# Patient Record
Sex: Female | Born: 1935 | Race: White | Hispanic: No | Smoking: Never smoker
Health system: Southern US, Community
[De-identification: ages and names within clinical notes are randomized; demographics above are authoritative.]

## PROBLEM LIST (undated history)

## (undated) DIAGNOSIS — I2510 Atherosclerotic heart disease of native coronary artery without angina pectoris: Secondary | ICD-10-CM

## (undated) DIAGNOSIS — M1990 Unspecified osteoarthritis, unspecified site: Secondary | ICD-10-CM

## (undated) DIAGNOSIS — I10 Essential (primary) hypertension: Secondary | ICD-10-CM

## (undated) DIAGNOSIS — E119 Type 2 diabetes mellitus without complications: Secondary | ICD-10-CM

## (undated) DIAGNOSIS — I639 Cerebral infarction, unspecified: Secondary | ICD-10-CM

## (undated) DIAGNOSIS — I509 Heart failure, unspecified: Secondary | ICD-10-CM

---

## 2014-10-19 ENCOUNTER — Ambulatory Visit
Admission: EM | Admit: 2014-10-19 | Discharge: 2014-10-19 | Disposition: A | Discharge status: Home / Self Care | Payer: Medicare HMO | Attending: Family Medicine | Admitting: Family Medicine

## 2014-10-19 ENCOUNTER — Ambulatory Visit: Payer: Medicare HMO

## 2014-10-19 DIAGNOSIS — I2510 Atherosclerotic heart disease of native coronary artery without angina pectoris: Secondary | ICD-10-CM | POA: Insufficient documentation

## 2014-10-19 DIAGNOSIS — E119 Type 2 diabetes mellitus without complications: Secondary | ICD-10-CM | POA: Insufficient documentation

## 2014-10-19 DIAGNOSIS — S92301A Fracture of unspecified metatarsal bone(s), right foot, initial encounter for closed fracture: Secondary | ICD-10-CM

## 2014-10-19 DIAGNOSIS — S92351A Displaced fracture of fifth metatarsal bone, right foot, initial encounter for closed fracture: Secondary | ICD-10-CM | POA: Diagnosis not present

## 2014-10-19 DIAGNOSIS — I509 Heart failure, unspecified: Secondary | ICD-10-CM | POA: Insufficient documentation

## 2014-10-19 DIAGNOSIS — X58XXXA Exposure to other specified factors, initial encounter: Secondary | ICD-10-CM | POA: Insufficient documentation

## 2014-10-19 DIAGNOSIS — Z8673 Personal history of transient ischemic attack (TIA), and cerebral infarction without residual deficits: Secondary | ICD-10-CM | POA: Insufficient documentation

## 2014-10-19 DIAGNOSIS — M79671 Pain in right foot: Secondary | ICD-10-CM | POA: Diagnosis present

## 2014-10-19 DIAGNOSIS — I10 Essential (primary) hypertension: Secondary | ICD-10-CM | POA: Diagnosis not present

## 2014-10-19 HISTORY — DX: Type 2 diabetes mellitus without complications: E11.9

## 2014-10-19 HISTORY — DX: Cerebral infarction, unspecified: I63.9

## 2014-10-19 HISTORY — DX: Heart failure, unspecified: I50.9

## 2014-10-19 HISTORY — DX: Atherosclerotic heart disease of native coronary artery without angina pectoris: I25.10

## 2014-10-19 HISTORY — DX: Unspecified osteoarthritis, unspecified site: M19.90

## 2014-10-19 HISTORY — DX: Essential (primary) hypertension: I10

## 2014-10-19 NOTE — Discharge Instructions (Signed)
Keep splint in place. Use walker when ambulating. Take over-the-counter Tylenol as needed for pain. Apply ice and elevate.  Follow-up with podiatry or orthopedist within 1 week. You may follow-up with podiatrist or orthopedist of your choice or the above. OR you can see your orthopedist in [place].   Return to urgent care for new or worsening concerns.  Metatarsal Fracture A metatarsal fracture is a break in the bone(s) of the foot. These are the bones of the foot that connect your toes to the bones of the ankle. DIAGNOSIS  The diagnoses of these fractures are usually made with X-rays. If there are problems in the forefoot and x-rays are normal a later bone scan will usually make the diagnosis.  TREATMENT AND HOME CARE INSTRUCTIONS  Treatment may or may not include a cast or walking shoe. When casts are needed the use is usually for short periods of time so as not to slow down healing with muscle wasting (atrophy).  Activities should be stopped until further advised by your caregiver.  Wear shoes with adequate shock absorbing capabilities and stiff soles.  Alternative exercise may be undertaken while waiting for healing. These may include bicycling and swimming, or as your caregiver suggests.  It is important to keep all follow-up visits or specialty referrals. The failure to keep these appointments could result in improper bone healing and chronic pain or disability.  Warning: Do not drive a car or operate a motor vehicle until your caregiver specifically tells you it is safe to do so. IF YOU DO NOT HAVE A CAST OR SPLINT:  You may walk on your injured foot as tolerated or advised.  Do not put any weight on your injured foot for as long as directed by your caregiver. Slowly increase the amount of time you walk on the foot as the pain allows or as advised.  Use crutches until you can bear weight without pain. A gradual increase in weight bearing may help.  Apply ice to the injury for  15-20 minutes each hour while awake for the first 2 days. Put the ice in a plastic bag and place a towel between the bag of ice and your skin.  Only take over-the-counter or prescription medicines for pain, discomfort, or fever as directed by your caregiver. SEEK IMMEDIATE MEDICAL CARE IF:   Your cast gets damaged or breaks.  You have continued severe pain or more swelling than you did before the cast was put on, or the pain is not controlled with medications.  Your skin or nails below the injury turn blue or grey, or feel cold or numb.  There is a bad smell, or new stains or pus-like (purulent) drainage coming from the cast. MAKE SURE YOU:   Understand these instructions.  Will watch your condition.  Will get help right away if you are not doing well or get worse. Document Released: 10/13/2001 Document Revised: 04/15/2011 Document Reviewed: 09/04/2007 [facility] Patient Information 2015 [place], [place]. This information is not intended to replace advice given to you by your health care provider. Make sure you discuss any questions you have with your health care provider.

## 2014-10-19 NOTE — ED Provider Notes (Signed)
[facility] Emergency Department Provider Note  ____________________________________________  Time seen: Approximately 12:41 PM  I have reviewed the triage vital signs and the nursing notes.   HISTORY  Chief Complaint Foot Injury   HPI Sarah Hull is a 81 y.o. female  presents for the complaints of right lateral foot pain. Patient reports that this past Monday when she was getting up out of her chair she states that her right foot slightly rolled outwards which causes pain. Patient states that she did not fall. States that she was able to catch herself on her table. Denies other pain or injury. Patient reports that she has had a right lateral foot pain since the incident. Patient brother with her at urgent care. Patient reports that she lives at home alone but does live near her family including her brother.  Patient states that pain is mostly with walking. States that she's having to walk more on the inside of her foot. States pain is currently 3 out of 10 and aching. Patient reports bruising has been present since as well. Denies numbness or tingling sensation.  Patient again denies fall, head injury or loss of consciousness. Patient reports that she does ambulate at home with a walker or a cane.   Past Medical History  Diagnosis Date  . Diabetes mellitus without complication   . Hypertension   . CHF (congestive heart failure)   . Stroke   . Arthritis   . Coronary artery disease     There are no active problems to display for this patient.   History reviewed. No pertinent past surgical history.  Current Outpatient Rx  Name  Route  Sig  Dispense  Refill  . albuterol (PROVENTIL HFA;VENTOLIN HFA) 108 (90 BASE) MCG/ACT inhaler   Inhalation   Inhale into the lungs every 6 (six) hours as needed for wheezing or shortness of breath.         . budesonide-formoterol (SYMBICORT) 160-4.5 MCG/ACT inhaler   Inhalation   Inhale 2 puffs into the lungs 2  (two) times daily.         . dabigatran (PRADAXA) 75 MG CAPS capsule   Oral   Take 75 mg by mouth 2 (two) times daily.         Marland Kitchen diltiazem (TIAZAC) 240 MG 24 hr capsule   Oral   Take 240 mg by mouth daily.         . furosemide (LASIX) 20 MG tablet   Oral   Take 20 mg by mouth.         . gabapentin (NEURONTIN) 300 MG capsule   Oral   Take 300 mg by mouth at bedtime.         . pantoprazole (PROTONIX) 20 MG tablet   Oral   Take 20 mg by mouth daily.         . potassium chloride (K-DUR,KLOR-CON) 10 MEQ tablet   Oral   Take 10 mEq by mouth 2 (two) times daily.         Marland Kitchen spironolactone (ALDACTONE) 50 MG tablet   Oral   Take 50 mg by mouth daily.           Allergies Review of patient's allergies indicates not on file.  No family history on file.  Social History Social History  Substance Use Topics  . Smoking status: Never Smoker   . Smokeless tobacco: None  . Alcohol Use: No    Review of Systems Constitutional: No fever/chills Eyes: No  visual changes. ENT: No sore throat. Cardiovascular: Denies chest pain. Respiratory: Denies shortness of breath. Gastrointestinal: No abdominal pain.  No nausea, no vomiting.  No diarrhea.  No constipation. Genitourinary: Negative for dysuria. Musculoskeletal: Negative for back pain. Right foot pain as above. Skin: Negative for rash. Neurological: Negative for headaches, focal weakness or numbness.  10-point ROS otherwise negative.  ____________________________________________   PHYSICAL EXAM:  VITAL SIGNS: ED Triage Vitals  Enc Vitals Group     BP --      Pulse --      Resp --      Temp --      Temp src --      SpO2 --      Weight --      Height --      Head Cir --      Peak Flow --      Pain Score 10/19/14 1214 8     Pain Loc --      Pain Edu? --      Excl. in GC? --    Blood pressure 123/67, pulse 84, temperature 98.4 F (36.9 C), temperature source Oral, resp. rate 16, height  (1.448  m), weight 90 lb 8 oz (41.051 kg), SpO2 98 %.   Constitutional: Alert and oriented. Well appearing and in no acute distress. Eyes: Conjunctivae are normal. PERRL. EOMI. Head: Atraumatic.  Nose: No congestion/rhinnorhea.  Mouth/Throat: Mucous membranes are moist.  Neck: No stridor.  No cervical spine tenderness to palpation. Hematological/Lymphatic/Immunilogical: No cervical lymphadenopathy. Cardiovascular: Normal rate, regular rhythm. Grossly normal heart sounds.  Good peripheral circulation. Respiratory: Normal respiratory effort.  No retractions. Lungs CTAB. Gastrointestinal: Soft and nontender. No distention. Normal Bowel sounds.  No abdominal bruits. No CVA tenderness. Musculoskeletal: No lower or upper extremity tenderness nor edema.  No joint effusions. Bilateral pedal pulses equal and easily palpated. No cervical, thoracic or lumbar tenderness to palpation. Except: Right dorsal lateral foot moderate ecchymosis, minimal swelling. Right dorsal lateral foot along the fifth metatarsal mild to moderate tenderness to palpation. Pain with dorsiflexion and plantar flexion. Full range of motion present. Distal sensation to toes intact. Cap refill less than 2 seconds. Bilateral pedal pulses equal and easily palpated. No erythema. Skin intact. Bilateral calf nontender. Right lower extremity nontender except for right foot. Patient ambulatory in room. Mild antalgic gait.  Neurologic:  Normal speech and language. No gross focal neurologic deficits are appreciated.  Skin:  Skin is warm, dry and intact. No rash noted. Psychiatric: Mood and affect are normal. Speech and behavior are normal.  ____________________________________________   LABS (all labs ordered are listed, but only abnormal results are displayed)  Labs Reviewed - No data to display  RADIOLOGY  EXAM: RIGHT FOOT COMPLETE - 3+ VIEW  COMPARISON: None.  FINDINGS: There is displaced fracture of the fifth metatarsal shaft. There  is no dislocation. Minimal plantar calcaneal spur is noted.  IMPRESSION: Fracture of fifth metatarsal shaft.   Electronically Signed By: Sherian Rein M.D. On: 10/19/2014 12:44  I, Renford Dills, personally viewed and evaluated these images (plain radiographs) as part of my medical decision making.   ____________________________________________   PROCEDURES  Procedure(s) performed:  Posterior foot OCL splint applied by RN. Postoperative shoe unfortunately goes directly over metatarsal site which would increase pain as well as walking boot is too heavy for patient. Will also send patient home with pronged walker. Neurovascular intact post splint application.  ___________________________________   INITIAL IMPRESSION / ASSESSMENT AND PLAN /  ED COURSE  Pertinent labs & imaging results that were available during my care of the patient were reviewed by me and considered in my medical decision making (see chart for details).  Very well-appearing patient. No acute distress. Presents with complaints of right lateral foot pain. Denies fall or other injury. Patient reports she stood up and rolled right foot slightly causing pain. Denies fall.   Right foot x-ray reviewed with positive fracture fifth metatarsal shaft, no dislocation. Will place patient in splint. Patient denies need for pain medication. Discussed supportive measures including ice and elevation and rest. Discussed use of assistive devices for ambulation. Patient reports that she does have a walker at home but states that her walker has wheels, and due to concern for fall recommend pronged walker. Prescription given for 4 pronged walker.  Patient reports that she does have an orthopedist that she follows with for her left knee arthritis [facility]. Recommend follow-up with podiatrist or orthopedist within 1 week. Also information given for Dr. Hyacinth Meeker orthopedist and Dr Alberteen Spindle podiatrist. Patient verbalized understanding and agreed  to plan. Patient reports that she will call to schedule appointment today.Discussed follow up and return parameters, patient verbalized understanding and agreed to plan.   ____________________________________________   FINAL CLINICAL IMPRESSION(S) / ED DIAGNOSES  Final diagnoses:  Fracture of fifth metatarsal bone, right, closed, initial encounter       Renford Dills, NP 10/19/14 1333

## 2014-10-19 NOTE — ED Notes (Signed)
Pt states "I twist my right foot when I was getting up out of my chair. I have had pain and bruising since."

## 2014-11-21 ENCOUNTER — Emergency Department: Payer: Medicare HMO

## 2014-11-21 ENCOUNTER — Encounter: Payer: Self-pay | Admitting: Oncology

## 2014-11-21 ENCOUNTER — Emergency Department
Admission: EM | Admit: 2014-11-21 | Discharge: 2014-11-21 | Disposition: A | Discharge status: Home / Self Care | Payer: Medicare HMO | Attending: Emergency Medicine | Admitting: Emergency Medicine

## 2014-11-21 DIAGNOSIS — R609 Edema, unspecified: Secondary | ICD-10-CM

## 2014-11-21 DIAGNOSIS — Z7902 Long term (current) use of antithrombotics/antiplatelets: Secondary | ICD-10-CM | POA: Insufficient documentation

## 2014-11-21 DIAGNOSIS — I10 Essential (primary) hypertension: Secondary | ICD-10-CM | POA: Diagnosis not present

## 2014-11-21 DIAGNOSIS — Z7951 Long term (current) use of inhaled steroids: Secondary | ICD-10-CM | POA: Diagnosis not present

## 2014-11-21 DIAGNOSIS — E119 Type 2 diabetes mellitus without complications: Secondary | ICD-10-CM | POA: Diagnosis not present

## 2014-11-21 DIAGNOSIS — R2242 Localized swelling, mass and lump, left lower limb: Secondary | ICD-10-CM | POA: Diagnosis present

## 2014-11-21 DIAGNOSIS — Z79899 Other long term (current) drug therapy: Secondary | ICD-10-CM | POA: Diagnosis not present

## 2014-11-21 DIAGNOSIS — R202 Paresthesia of skin: Secondary | ICD-10-CM | POA: Diagnosis not present

## 2014-11-21 NOTE — ED Notes (Signed)
EMS has been called 

## 2014-11-21 NOTE — ED Notes (Signed)
Leg wrapped in ACE bandage per MD order.

## 2014-11-21 NOTE — ED Provider Notes (Signed)
[facility] Emergency Department Provider Note  Time seen: 4:32 PM  I have reviewed the triage vital signs and the nursing notes.   HISTORY  Chief Complaint Leg Swelling and Cold Extremity    HPI Sarah Hull is a 81 y.o. female with a past medical history of diabetes, hypertension, COPD, CVA, CHF with peripheral edema presents the emergency department with left lower extremity swelling. According to the patient she has lower extremity swelling bilaterally, today staff noted that it hurt when they touched it so they sent her to the emergency department for evaluation. Patient denies any chest pain or trouble breathing. States a history of congestive heart failure with lower extremity swelling which is normal for her per patient. Does note that her legs seem more swollen today than normal.     Past Medical History  Diagnosis Date  . Diabetes mellitus without complication (HCC)   . Hypertension   . CHF (congestive heart failure) (HCC)   . Stroke (HCC)   . Arthritis   . Coronary artery disease     There are no active problems to display for this patient.   History reviewed. No pertinent past surgical history.  Current Outpatient Rx  Name  Route  Sig  Dispense  Refill  . albuterol (PROVENTIL HFA;VENTOLIN HFA) 108 (90 BASE) MCG/ACT inhaler   Inhalation   Inhale into the lungs every 6 (six) hours as needed for wheezing or shortness of breath.         . budesonide-formoterol (SYMBICORT) 160-4.5 MCG/ACT inhaler   Inhalation   Inhale 2 puffs into the lungs 2 (two) times daily.         . dabigatran (PRADAXA) 75 MG CAPS capsule   Oral   Take 75 mg by mouth 2 (two) times daily.         Marland Kitchen diltiazem (TIAZAC) 240 MG 24 hr capsule   Oral   Take 240 mg by mouth daily.         . furosemide (LASIX) 20 MG tablet   Oral   Take 20 mg by mouth.         . gabapentin (NEURONTIN) 300 MG capsule   Oral   Take 300 mg by mouth at bedtime.         .  pantoprazole (PROTONIX) 20 MG tablet   Oral   Take 20 mg by mouth daily.         . potassium chloride (K-DUR,KLOR-CON) 10 MEQ tablet   Oral   Take 10 mEq by mouth 2 (two) times daily.         Marland Kitchen spironolactone (ALDACTONE) 50 MG tablet   Oral   Take 50 mg by mouth daily.           Allergies Review of patient's allergies indicates no known allergies.  History reviewed. No pertinent family history.  Social History Social History  Substance Use Topics  . Smoking status: Never Smoker   . Smokeless tobacco: None  . Alcohol Use: No    Review of Systems Constitutional: Negative for fever Cardiovascular: Negative for chest pain. Respiratory: Negative for shortness of breath. Gastrointestinal: Negative for abdominal pain Musculoskeletal: Positive for left extremity pain Neurological: Negative for headache 10-point ROS otherwise negative.  ____________________________________________   PHYSICAL EXAM:  VITAL SIGNS: ED Triage Vitals  Enc Vitals Group     BP 11/21/14 1603 118/65 mmHg     Pulse Rate 11/21/14 1603 69     Resp --  Temp 11/21/14 1603 98.3 F (36.8 C)     Temp Source 11/21/14 1603 Oral     SpO2 11/21/14 1603 97 %     Weight 11/21/14 1603 105 lb (47.628 kg)     Height 11/21/14 1603 4\' 9"  (1.448 m)     Head Cir --      Peak Flow --      Pain Score 11/21/14 1631 0     Pain Loc --      Pain Edu? --      Excl. in GC? --    Constitutional: Alert and oriented. Well appearing and in no distress. Eyes: Normal exam ENT   Head: Normocephalic and atraumatic.   Mouth/Throat: Mucous membranes are moist. Cardiovascular: Normal rate, regular rhythm.  Respiratory: Normal respiratory effort without tachypnea nor retractions. Breath sounds are clear and equal bilaterally. No wheezes/rales/rhonchi. Gastrointestinal: Soft and nontender. No distention.   Musculoskeletal: 3+ pitting edema in bilateral lower extremities. Patient does have mild weeping of the  left lower extremity. Left extremity is cool to the touch. Strong dopplerable pulses DP, PT. Neurologic:  Normal speech and language. No gross focal neurologic deficits Skin:  Skin is warm, dry and intact.  Psychiatric: Mood and affect are normal. Speech and behavior are normal. ____________________________________________     RADIOLOGY  Ultrasound negative for DVT.  ____________________________________________   INITIAL IMPRESSION / ASSESSMENT AND PLAN / ED COURSE  Pertinent labs & imaging results that were available during my care of the patient were reviewed by me and considered in my medical decision making (see chart for details).  Patient with increased lower extremity edema. Her edema appears largely equal bilaterally. The left lower extremity does feel more cool to the touch in the right lower extremity. However the patient has very strong dopplerable pulses in her DP and PT. We will obtain an ultrasound to rule out DVT. If negative for DVT we will attempt to wrap the lower extremity to help decrease peripheral edema. We'll also increase her home Lasix for the next 3 days and have her follow-up with her cardiologist. Patient is here with her family, they're both agreeable to this plan.  Ultrasound negative for DVT. We will wrap the patient's lower extremity, and discharged home with primary care follow-up tomorrow. Patient agreeable to plan.  ____________________________________________   FINAL CLINICAL IMPRESSION(S) / ED DIAGNOSES  Peripheral edema   Minna AntisKevin Rollande Thursby, MD 11/21/14 1821

## 2014-11-21 NOTE — ED Notes (Signed)
Patient dressed with assistance from staff. Ready for transport.

## 2014-11-21 NOTE — Discharge Instructions (Signed)
Please keep your leg wrapped an elevated for comfort, when not in use. Please file. Primary care physician in the next 1-2 days for reevaluation. Return to the emergency department for any acute worsening of pain, fever, or any other symptom personally concerning to yourself. Please increase her Lasix (furosemide) from 20 mg daily to 40 mg daily for the next 3 days.   Peripheral Edema You have swelling in your legs (peripheral edema). This swelling is due to excess accumulation of salt and water in your body. Edema may be a sign of heart, kidney or liver disease, or a side effect of a medication. It may also be due to problems in the leg veins. Elevating your legs and using special support stockings may be very helpful, if the cause of the swelling is due to poor venous circulation. Avoid long periods of standing, whatever the cause. Treatment of edema depends on identifying the cause. Chips, pretzels, pickles and other salty foods should be avoided. Restricting salt in your diet is almost always needed. Water pills (diuretics) are often used to remove the excess salt and water from your body via urine. These medicines prevent the kidney from reabsorbing sodium. This increases urine flow. Diuretic treatment may also result in lowering of potassium levels in your body. Potassium supplements may be needed if you have to use diuretics daily. Daily weights can help you keep track of your progress in clearing your edema. You should call your caregiver for follow up care as recommended. SEEK IMMEDIATE MEDICAL CARE IF:   You have increased swelling, pain, redness, or heat in your legs.  You develop shortness of breath, especially when lying down.  You develop chest or abdominal pain, weakness, or fainting.  You have a fever.   This information is not intended to replace advice given to you by your health care provider. Make sure you discuss any questions you have with your health care provider.     Document Released: 02/29/2004 Document Revised: 04/15/2011 Document Reviewed: 08/03/2014 Elsevier Interactive Patient Education [employer]2016 [employer]

## 2014-11-21 NOTE — ED Notes (Addendum)
Patient being discharged back to [facility]. Family and Patient are aware and agreeable to discharge. [facility] Margaretha Glassing(Loretta) has been given report. Patient will be transported EMS.

## 2014-11-21 NOTE — ED Notes (Addendum)
[facility] Patient is alert and oriented to self, place, time, and situation. Left leg is swollen with 3+ pitting edema, cold to touch, weeping, and capillary refill >5 seconds. Left dorsalis pedis and posterior tibial pulses audible via doppler. Patient states it hurts "when they touch it".

## 2015-01-05 DEATH — deceased

## 2017-02-02 IMAGING — US US EXTREM LOW VENOUS*L*
1 series · 13 of 24 positions shown · non-contrast
Comparison: None.

CLINICAL DATA: [AGE] female with leg swelling, left greater
than right for several days.



[Series 1: us extrem low venous*left* · 0.08mm/px · 13 of 35 slices shown]
[im 1/35]
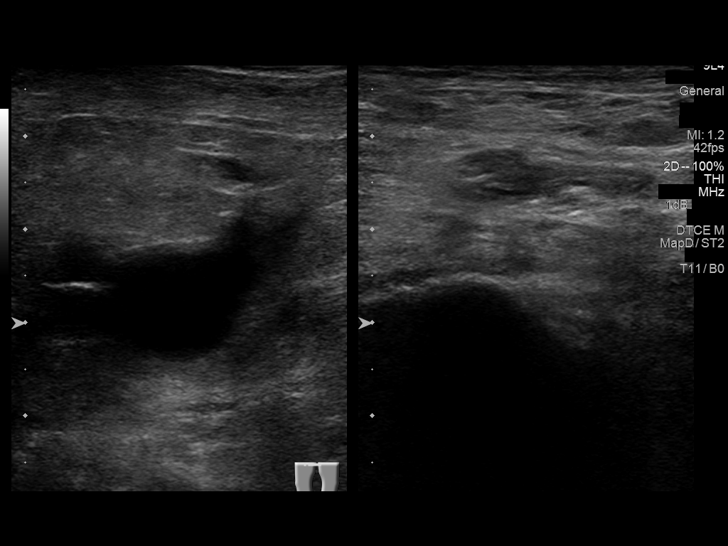
[im 3/35]
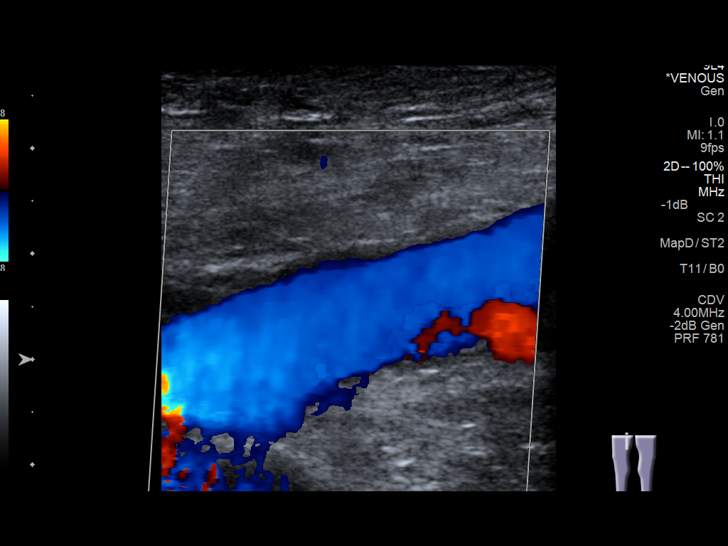
[im 6/35]
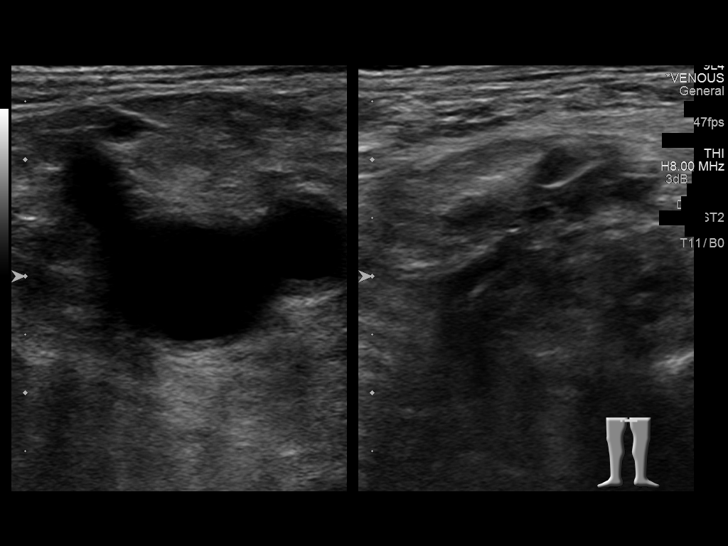
[im 9/35]
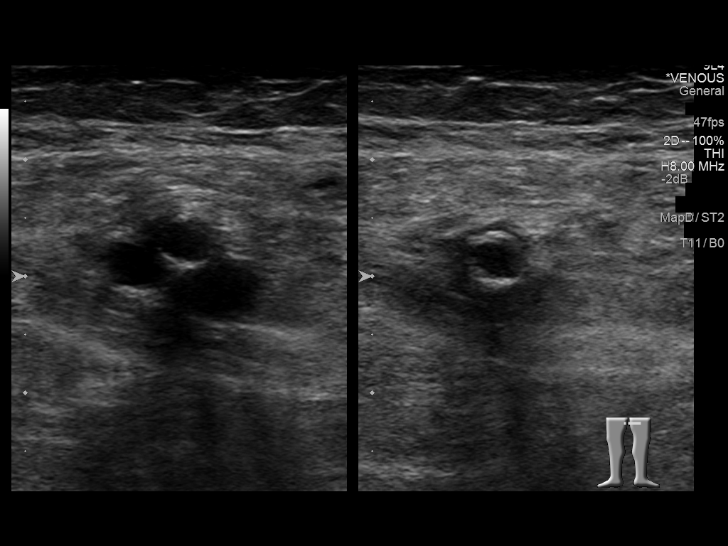
[im 12/35]
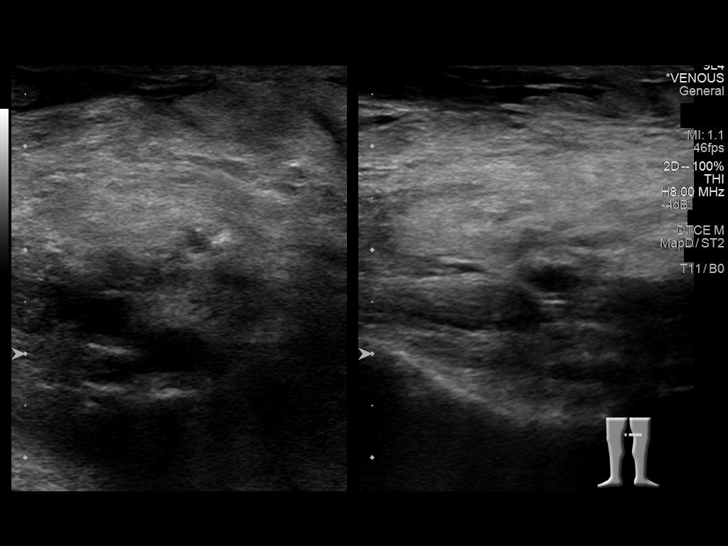
[im 15/35]
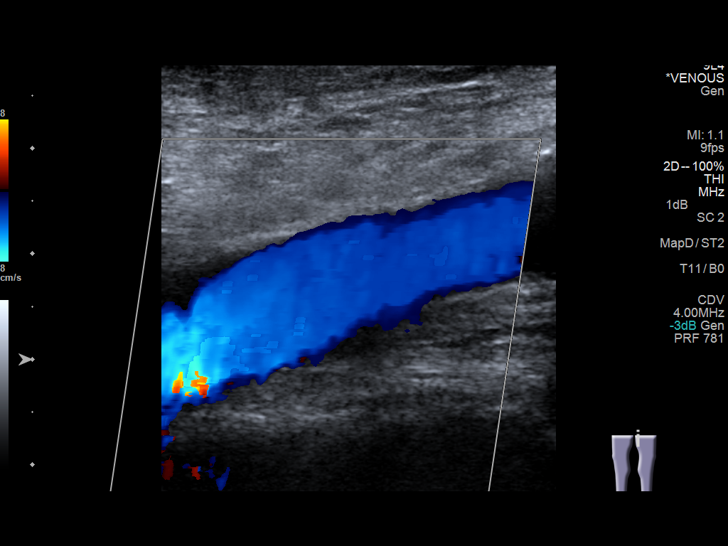
[im 18/35]
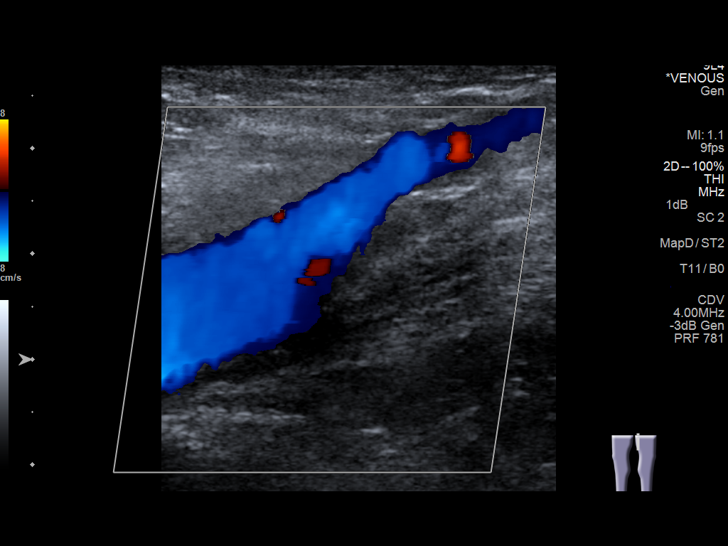
[im 20/35]
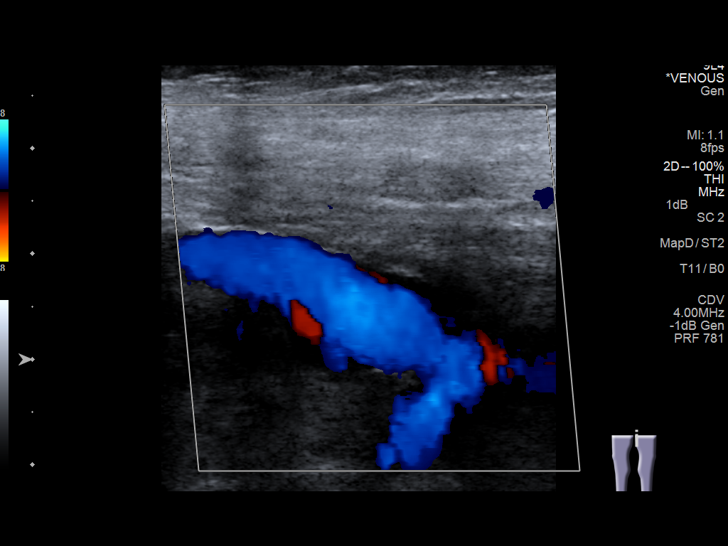
[im 23/35]
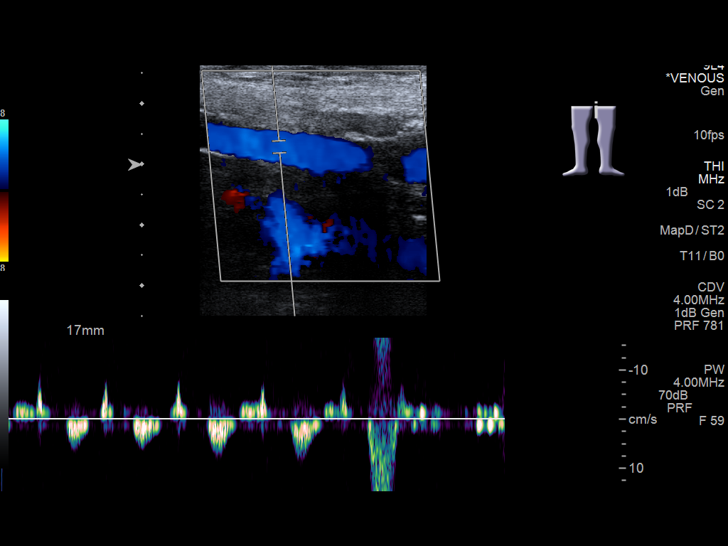
[im 26/35]
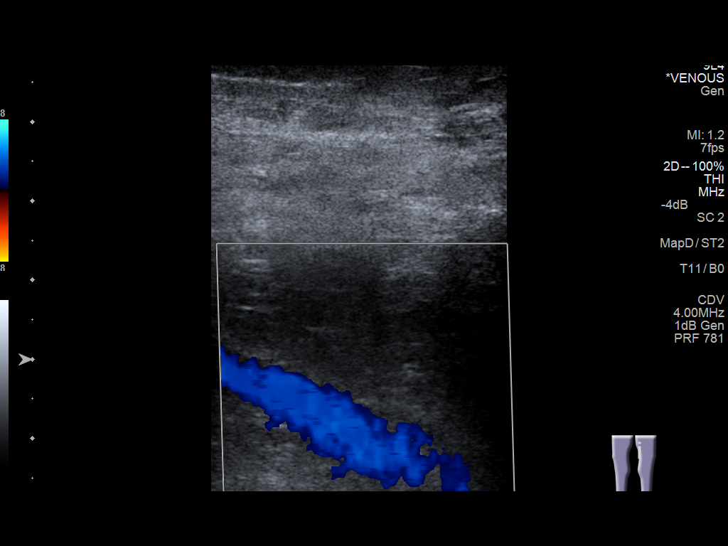
[im 29/35]
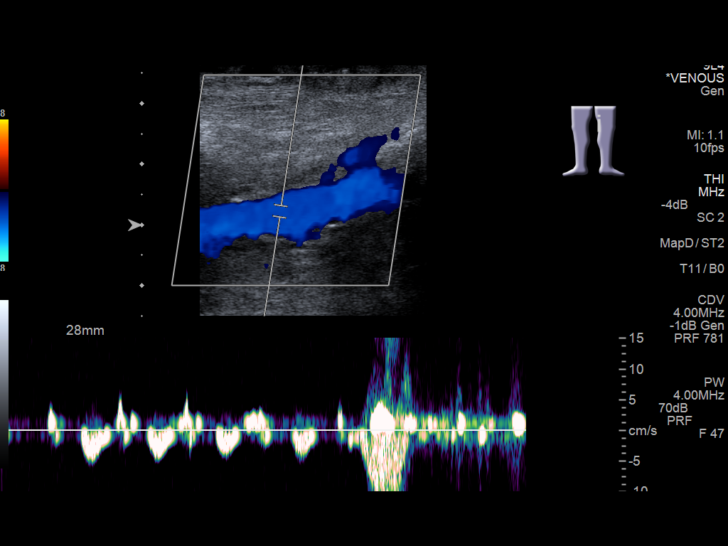
[im 32/35]
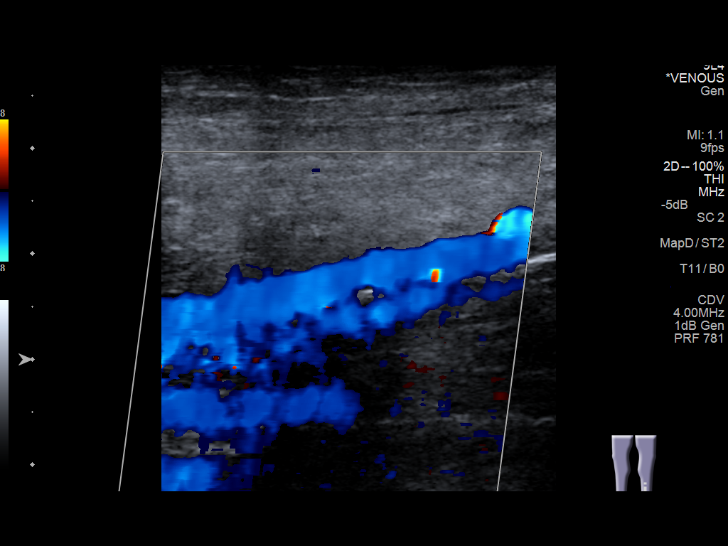
[im 35/35]
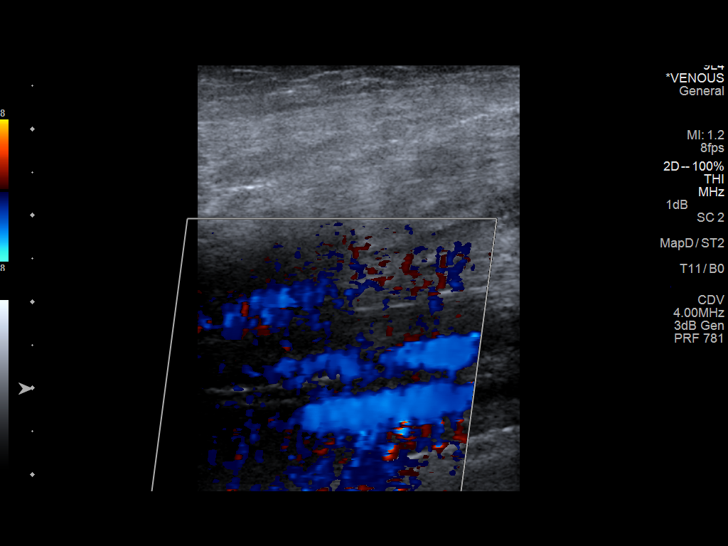

[13 of 24 positions shown; findings below may reference images not displayed]

FINDINGS: Contralateral Common Femoral Vein: Respiratory phasicity is normal
and symmetric with the symptomatic side. No evidence of thrombus.
Normal compressibility.

Common Femoral Vein: No evidence of thrombus. Normal
compressibility, respiratory phasicity and response to augmentation.

Saphenofemoral Junction: No evidence of thrombus. Normal
compressibility and flow on color Doppler imaging.

Profunda Femoral Vein: No evidence of thrombus. Normal
compressibility and flow on color Doppler imaging.

Femoral Vein: No evidence of thrombus. Normal compressibility,
respiratory phasicity and response to augmentation.

Popliteal Vein: No evidence of thrombus. Normal compressibility,
respiratory phasicity and response to augmentation.

Calf Veins: No evidence of thrombus. Normal compressibility and flow
on color Doppler imaging.

Superficial Great Saphenous Vein: No evidence of thrombus. Normal
compressibility and flow on color Doppler imaging.

Venous Reflux:  None.

Other Findings: Mild subcutaneous edema seen in the distal lower
leg.
IMPRESSION: No evidence of left lower extremity deep venous thrombosis.
